# Patient Record
Sex: Male | Born: 1965 | Race: Black or African American | Hispanic: No | Marital: Married | Smoking: Current every day smoker
Health system: Southern US, Community
[De-identification: ages and names within clinical notes are randomized; demographics above are authoritative.]

## PROBLEM LIST (undated history)

## (undated) DIAGNOSIS — Z789 Other specified health status: Secondary | ICD-10-CM

## (undated) DIAGNOSIS — D649 Anemia, unspecified: Secondary | ICD-10-CM

## (undated) HISTORY — DX: Anemia, unspecified: D64.9

---

## 2000-11-28 ENCOUNTER — Emergency Department (HOSPITAL_COMMUNITY): Admission: EM | Admit: 2000-11-28 | Discharge: 2000-11-28 | Payer: Self-pay | Admitting: Emergency Medicine

## 2000-11-28 ENCOUNTER — Encounter: Payer: Self-pay | Admitting: Emergency Medicine

## 2005-02-09 ENCOUNTER — Emergency Department: Payer: Self-pay | Admitting: Emergency Medicine

## 2007-03-13 ENCOUNTER — Emergency Department: Payer: Self-pay | Admitting: Emergency Medicine

## 2009-01-16 ENCOUNTER — Emergency Department: Payer: Self-pay | Admitting: Unknown Physician Specialty

## 2009-01-27 ENCOUNTER — Emergency Department: Payer: Self-pay | Admitting: Emergency Medicine
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
## 2014-12-04 ENCOUNTER — Emergency Department
Admission: EM | Admit: 2014-12-04 | Discharge: 2014-12-04 | Disposition: A | Discharge status: Home / Self Care | Payer: Self-pay | Attending: Emergency Medicine | Admitting: Emergency Medicine

## 2014-12-04 ENCOUNTER — Encounter: Payer: Self-pay | Admitting: Emergency Medicine

## 2014-12-04 DIAGNOSIS — M25461 Effusion, right knee: Secondary | ICD-10-CM | POA: Insufficient documentation

## 2014-12-04 DIAGNOSIS — Z720 Tobacco use: Secondary | ICD-10-CM | POA: Insufficient documentation

## 2014-12-04 MED ORDER — HYDROCODONE-ACETAMINOPHEN 5-325 MG PO TABS: 1.0000 | ORAL_TABLET | ORAL | Status: DC | PRN

## 2014-12-04 MED ORDER — IBUPROFEN 800 MG PO TABS: 800.0000 mg | ORAL_TABLET | Freq: Three times a day (TID) | ORAL | Status: DC | PRN

## 2014-12-04 MED ORDER — COLCHICINE 0.6 MG PO TABS: 1.2000 mg | ORAL_TABLET | Freq: Once | ORAL | Status: DC

## 2014-12-04 NOTE — Discharge Instructions (Signed)

## 2014-12-04 NOTE — ED Provider Notes (Signed)
[facility] Emergency Department Provider Note  ____________________________________________  Time seen: Approximately 9:27 AM  I have reviewed the triage vital signs and the nursing notes.   HISTORY  Chief Complaint Joint Swelling    HPI Devin Farrierdwin Devin Matthews is a 49 y.o. male is a one-week history of right knee pain progressively getting worse. Complains of increased swelling and tenderness. No warmth. Denies any recent injury or trauma.   History reviewed. No pertinent past medical history.  There are no active problems to display for this patient.   History reviewed. No pertinent past surgical history.  Current Outpatient Rx  Name  Route  Sig  Dispense  Refill  . colchicine 0.6 MG tablet   Oral   Take 2 tablets (1.2 mg total) by mouth once.   3 tablet   0   . HYDROcodone-acetaminophen (NORCO) 5-325 MG per tablet   Oral   Take 1-2 tablets by mouth every 4 (four) hours as needed for moderate pain.   15 tablet   0   . ibuprofen (ADVIL,MOTRIN) 800 MG tablet   Oral   Take 1 tablet (800 mg total) by mouth every 8 (eight) hours as needed.   30 tablet   0     Allergies Review of patient's allergies indicates no known allergies.  No family history on file.  Social History History  Substance Use Topics  . Smoking status: Current Every Day Smoker -- 1.00 packs/day    Types: Cigarettes  . Smokeless tobacco: Not on file  . Alcohol Use: Yes     Comment: beer    Review of Systems Constitutional: No fever/chills Eyes: No visual changes. ENT: No sore throat. Cardiovascular: Denies chest pain. Respiratory: Denies shortness of breath. Gastrointestinal: No abdominal pain.  No nausea, no vomiting.  No diarrhea.  No constipation. Genitourinary: Negative for dysuria. Musculoskeletal: Positive for right knee swelling and pain.. Skin: Negative for rash. Neurological: Negative for headaches, focal weakness or numbness.  10-point ROS otherwise  negative.  ____________________________________________   PHYSICAL EXAM:  VITAL SIGNS: ED Triage Vitals  Enc Vitals Group     BP 12/04/14 0919 115/84 mmHg     Pulse Rate 12/04/14 0919 74     Resp 12/04/14 0919 17     Temp 12/04/14 0919 97.6 F (36.4 C)     Temp Source 12/04/14 0919 Oral     SpO2 12/04/14 0919 99 %     Weight 12/04/14 0916 145 lb (65.772 kg)     Height 12/04/14 0916 5\' 11"  (1.803 m)     Head Cir --      Peak Flow --      Pain Score 12/04/14 0917 5     Pain Loc --      Pain Edu? --      Excl. in GC? --     Constitutional: Alert and oriented. Well appearing and in no acute distress. Eyes: Conjunctivae are normal. PERRL. EOMI. Head: Atraumatic. Nose: No congestion/rhinnorhea. Mouth/Throat: Mucous membranes are moist.  Oropharynx non-erythematous. Neck: No stridor.   Cardiovascular: Normal rate, regular rhythm. Grossly normal heart sounds.  Good peripheral circulation. Respiratory: Normal respiratory effort.  No retractions. Lungs CTAB. Gastrointestinal: Soft and nontender. No distention. No abdominal bruits. No CVA tenderness. Musculoskeletal: Positive for effusion to the right knee with warmth and tenderness. Range of motion no increased pain with anterior drawer/posterior drawer. No laxity noted. Neurologic:  Normal speech and language. No gross focal neurologic deficits are appreciated. Speech is normal.  No gait instability. Skin:  Skin is warm, dry and intact. No rash noted. Psychiatric: Mood and affect are normal. Speech and behavior are normal.  ____________________________________________   LABS (all labs ordered are listed, but only abnormal results are displayed)  Labs Reviewed - No data to display ____________________________________________   RADIOLOGY  Deferred ____________________________________________   PROCEDURES  Procedure(s) performed: None  Critical Care performed:  No  ____________________________________________   INITIAL IMPRESSION / ASSESSMENT AND PLAN / ED COURSE  Pertinent labs & imaging results that were available during my care of the patient were reviewed by me and considered in my medical decision making (see chart for details).  Right knee effusion, consider acute gouty arthritis.. Rx given for treatment 100 mg 3 times a day, and Norco 5/325, colchicine 0.6 mg 2 initially then one 1 hour. Patient follow-up with orthopedics on call.  Patient voices no other emergency medical complaints at this time and will return to the ER with worsening symptomology. ____________________________________________   FINAL CLINICAL IMPRESSION(S) / ED DIAGNOSES  Final diagnoses:  Knee effusion, right      Devin Dakin, PA-C 12/04/14 0940  Minna Antis, MD 12/04/14 1453
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
## 2023-02-01 ENCOUNTER — Encounter (HOSPITAL_COMMUNITY): Payer: Self-pay

## 2023-02-01 ENCOUNTER — Other Ambulatory Visit: Payer: Self-pay

## 2023-02-01 ENCOUNTER — Emergency Department (HOSPITAL_COMMUNITY)
Admission: EM | Admit: 2023-02-01 | Discharge: 2023-02-01 | Discharge status: Against Medical Advice | Payer: 59 | Attending: Emergency Medicine | Admitting: Emergency Medicine

## 2023-02-01 DIAGNOSIS — R222 Localized swelling, mass and lump, trunk: Secondary | ICD-10-CM | POA: Insufficient documentation

## 2023-02-01 DIAGNOSIS — Z5321 Procedure and treatment not carried out due to patient leaving prior to being seen by health care provider: Secondary | ICD-10-CM | POA: Insufficient documentation

## 2023-02-01 HISTORY — DX: Other specified health status: Z78.9

## 2023-02-01 NOTE — ED Triage Notes (Signed)
"  Noticed I have a knot on the left side of my groin yesterday and not sure where it came from" per pt  Denies injury.

## 2024-02-22 ENCOUNTER — Encounter: Payer: Self-pay | Admitting: Emergency Medicine

## 2024-02-22 ENCOUNTER — Emergency Department: Payer: Self-pay

## 2024-02-22 ENCOUNTER — Other Ambulatory Visit: Payer: Self-pay

## 2024-02-22 ENCOUNTER — Observation Stay
Admission: EM | Admit: 2024-02-22 | Discharge: 2024-02-24 | Disposition: A | Discharge status: Home / Self Care | Payer: Self-pay | Attending: Gastroenterology | Admitting: Gastroenterology

## 2024-02-22 DIAGNOSIS — K921 Melena: Secondary | ICD-10-CM | POA: Insufficient documentation

## 2024-02-22 DIAGNOSIS — J449 Chronic obstructive pulmonary disease, unspecified: Secondary | ICD-10-CM | POA: Insufficient documentation

## 2024-02-22 DIAGNOSIS — K640 First degree hemorrhoids: Secondary | ICD-10-CM | POA: Insufficient documentation

## 2024-02-22 DIAGNOSIS — K5730 Diverticulosis of large intestine without perforation or abscess without bleeding: Secondary | ICD-10-CM | POA: Insufficient documentation

## 2024-02-22 DIAGNOSIS — F17200 Nicotine dependence, unspecified, uncomplicated: Secondary | ICD-10-CM | POA: Diagnosis present

## 2024-02-22 DIAGNOSIS — R0789 Other chest pain: Secondary | ICD-10-CM

## 2024-02-22 DIAGNOSIS — K922 Gastrointestinal hemorrhage, unspecified: Principal | ICD-10-CM | POA: Insufficient documentation

## 2024-02-22 DIAGNOSIS — D649 Anemia, unspecified: Secondary | ICD-10-CM

## 2024-02-22 DIAGNOSIS — D62 Acute posthemorrhagic anemia: Secondary | ICD-10-CM | POA: Insufficient documentation

## 2024-02-22 DIAGNOSIS — F1090 Alcohol use, unspecified, uncomplicated: Secondary | ICD-10-CM | POA: Diagnosis present

## 2024-02-22 DIAGNOSIS — R079 Chest pain, unspecified: Secondary | ICD-10-CM | POA: Diagnosis present

## 2024-02-22 DIAGNOSIS — D500 Iron deficiency anemia secondary to blood loss (chronic): Secondary | ICD-10-CM | POA: Insufficient documentation

## 2024-02-22 DIAGNOSIS — F1020 Alcohol dependence, uncomplicated: Secondary | ICD-10-CM | POA: Insufficient documentation

## 2024-02-22 DIAGNOSIS — K274 Chronic or unspecified peptic ulcer, site unspecified, with hemorrhage: Secondary | ICD-10-CM

## 2024-02-22 DIAGNOSIS — F17210 Nicotine dependence, cigarettes, uncomplicated: Secondary | ICD-10-CM | POA: Insufficient documentation

## 2024-02-22 LAB — CBC
HCT: 17 % — ABNORMAL LOW (ref 39.0–52.0)
HCT: 19 % — ABNORMAL LOW (ref 39.0–52.0)
HCT: 23.3 % — ABNORMAL LOW (ref 39.0–52.0)
Hemoglobin: 5.2 g/dL — ABNORMAL LOW (ref 13.0–17.0)
Hemoglobin: 6 g/dL — ABNORMAL LOW (ref 13.0–17.0)
Hemoglobin: 7.6 g/dL — ABNORMAL LOW (ref 13.0–17.0)
MCH: 30.1 pg (ref 26.0–34.0)
MCH: 30.5 pg (ref 26.0–34.0)
MCH: 29.8 pg (ref 26.0–34.0)
MCHC: 30.6 g/dL (ref 30.0–36.0)
MCHC: 31.6 g/dL (ref 30.0–36.0)
MCHC: 32.6 g/dL (ref 30.0–36.0)
MCV: 98.3 fL (ref 80.0–100.0)
MCV: 96.4 fL (ref 80.0–100.0)
MCV: 91.4 fL (ref 80.0–100.0)
Platelets: 384 K/uL (ref 150–400)
Platelets: 359 K/uL (ref 150–400)
Platelets: 348 K/uL (ref 150–400)
RBC: 1.73 MIL/uL — ABNORMAL LOW (ref 4.22–5.81)
RBC: 1.97 MIL/uL — ABNORMAL LOW (ref 4.22–5.81)
RBC: 2.55 MIL/uL — ABNORMAL LOW (ref 4.22–5.81)
RDW: 16.5 % — ABNORMAL HIGH (ref 11.5–15.5)
RDW: 16.6 % — ABNORMAL HIGH (ref 11.5–15.5)
RDW: 17.5 % — ABNORMAL HIGH (ref 11.5–15.5)
WBC: 4.1 K/uL (ref 4.0–10.5)
WBC: 3.1 K/uL — ABNORMAL LOW (ref 4.0–10.5)
WBC: 4.4 K/uL (ref 4.0–10.5)
nRBC: 0 % (ref 0.0–0.2)
nRBC: 0 % (ref 0.0–0.2)
nRBC: 0 % (ref 0.0–0.2)

## 2024-02-22 LAB — PROTIME-INR
INR: 1 (ref 0.8–1.2)
Prothrombin Time: 14.1 s (ref 11.4–15.2)

## 2024-02-22 LAB — BASIC METABOLIC PANEL WITH GFR
Anion gap: 11 (ref 5–15)
BUN: 10 mg/dL (ref 6–20)
CO2: 23 mmol/L (ref 22–32)
Calcium: 7.9 mg/dL — ABNORMAL LOW (ref 8.9–10.3)
Chloride: 101 mmol/L (ref 98–111)
Creatinine, Ser: 0.79 mg/dL (ref 0.61–1.24)
GFR, Estimated: >60 mL/min (ref >60)
Glucose, Bld: 105 mg/dL — ABNORMAL HIGH (ref 70–99)
Potassium: 3.6 mmol/L (ref 3.5–5.1)
Sodium: 135 mmol/L (ref 135–145)

## 2024-02-22 LAB — APTT: aPTT: 28 s (ref 24–36)

## 2024-02-22 LAB — LIPASE, BLOOD: Lipase: 29 U/L (ref 11–51)

## 2024-02-22 LAB — ABO/RH: ABO/RH(D): O POS

## 2024-02-22 LAB — PREPARE RBC (CROSSMATCH)

## 2024-02-22 LAB — HIV ANTIBODY (ROUTINE TESTING W REFLEX): HIV Screen 4th Generation wRfx: NONREACTIVE

## 2024-02-22 LAB — TROPONIN I (HIGH SENSITIVITY)
Troponin I (High Sensitivity): 12 ng/L (ref <18)
Troponin I (High Sensitivity): 13 ng/L (ref <18)

## 2024-02-22 MED ORDER — PANTOPRAZOLE SODIUM 40 MG IV SOLR
40.0000 mg | Freq: Two times a day (BID) | INTRAVENOUS | Status: DC
  Administered 2024-02-22 – 2024-02-23 (×3): 40 mg via INTRAVENOUS
  Filled 2024-02-22 (×3): qty 10

## 2024-02-22 MED ORDER — SODIUM CHLORIDE 0.9 % IV BOLUS
1000.0000 mL | Freq: Once | INTRAVENOUS | Status: AC
  Administered 2024-02-22: 1000 mL via INTRAVENOUS

## 2024-02-22 MED ORDER — ONDANSETRON HCL 4 MG/2ML IJ SOLN: 4.0000 mg | Freq: Four times a day (QID) | INTRAMUSCULAR | Status: DC | PRN

## 2024-02-22 MED ORDER — PANTOPRAZOLE SODIUM 40 MG IV SOLR: 40.0000 mg | Freq: Two times a day (BID) | INTRAVENOUS | Status: DC

## 2024-02-22 MED ORDER — SODIUM CHLORIDE 0.9 % IV SOLN: INTRAVENOUS | Status: AC

## 2024-02-22 MED ORDER — PANTOPRAZOLE SODIUM 40 MG IV SOLR
40.0000 mg | INTRAVENOUS | Status: AC
  Administered 2024-02-22 (×2): 40 mg via INTRAVENOUS
  Filled 2024-02-22 (×2): qty 10

## 2024-02-22 MED ORDER — ACETAMINOPHEN 325 MG PO TABS: 650.0000 mg | ORAL_TABLET | ORAL | Status: DC | PRN

## 2024-02-22 MED ORDER — SODIUM CHLORIDE 0.9 % IV SOLN: 10.0000 mL/h | Freq: Once | INTRAVENOUS | Status: DC

## 2024-02-22 NOTE — ED Notes (Signed)
 Advised nurse that patient has ready bed

## 2024-02-22 NOTE — ED Provider Notes (Signed)
 [facility] Provider Note    Event Date/Time   First MD Initiated Contact with Patient 02/22/24 [phone]     (approximate)   History   Chest Pain   HPI  Devin Matthews is a 58 y.o. male who presents to the ED for evaluation of Chest Pain   Patient presents to the ED alongside his wife for evaluation of exertional chest discomfort, dyspnea on exertion, presyncope and melena over the past few days.  Reports drinking a sixpack of beer every 1-2 days.  Infrequent but occasional ibuprofen  and BC powder use.  Has never had an endoscopy, no known medical problems or prescribed medications.   Physical Exam   Triage Vital Signs: ED Triage Vitals  Encounter Vitals Group     BP 02/22/24 0807 119/72     Girls Systolic BP Percentile --      Girls Diastolic BP Percentile --      Boys Systolic BP Percentile --      Boys Diastolic BP Percentile --      Pulse Rate 02/22/24 0807 86     Resp 02/22/24 0807 17     Temp 02/22/24 0807 98.3 F (36.8 C)     Temp Source 02/22/24 0807 Oral     SpO2 02/22/24 0807 97 %     Weight 02/22/24 0806 145 lb (65.8 kg)     Height 02/22/24 0806 5' 9 (1.753 m)     Head Circumference --      Peak Flow --      Pain Score 02/22/24 0806 6     Pain Loc --      Pain Education --      Exclude from Growth Chart --     Most recent vital signs: Vitals:   02/22/24 0807  BP: 119/72  Pulse: 86  Resp: 17  Temp: 98.3 F (36.8 C)  SpO2: 97%    General: Awake, no distress.  CV:  Good peripheral perfusion.  Resp:  Normal effort.  Abd:  No distention.  MSK:  No deformity noted.  Neuro:  No focal deficits appreciated. Other:     ED Results / Procedures / Treatments   Labs (all labs ordered are listed, but only abnormal results are displayed) Labs Reviewed  BASIC METABOLIC PANEL WITH GFR - Abnormal; Notable for the following components:      Result Value   Glucose, Bld 105 (*)    Calcium 7.9 (*)    All other components within  normal limits  CBC - Abnormal; Notable for the following components:   RBC 1.73 (*)    Hemoglobin 5.2 (*)    HCT 17.0 (*)    RDW 16.5 (*)    All other components within normal limits  APTT  PROTIME-INR  LIPASE, BLOOD  TYPE AND SCREEN  ABO/RH  PREPARE RBC (CROSSMATCH)  TROPONIN I (HIGH SENSITIVITY)  TROPONIN I (HIGH SENSITIVITY)    EKG Sinus rhythm with a rate of 80 bpm.  Normal axis and intervals.  No clear signs of acute ischemia.  Biphasic T waves to V2.  No comparison EKG.  RADIOLOGY CXR interpreted by me without evidence of acute cardiopulmonary pathology.  Official radiology report(s): DG Chest 2 View Result Date: 02/22/2024 EXAM: 2 VIEW(S) XRAY OF THE CHEST 02/22/2024 08:25:22 AM COMPARISON: None available. CLINICAL HISTORY: cp. Chest pain that radiates to left arm x 3 days that is worse when walking around. Current smoker. FINDINGS: LUNGS AND PLEURA: No focal pulmonary opacity.  No pulmonary edema. No pleural effusion. No pneumothorax. HEART AND MEDIASTINUM: No acute abnormality of the cardiac and mediastinal silhouettes. BONES AND SOFT TISSUES: Multilevel degenerative changes of thoracic spine. IMPRESSION: 1. No acute cardiopulmonary process. Electronically signed by: Waddell Calk MD 02/22/2024 08:46 AM EDT RP Workstation: [id number]    PROCEDURES and INTERVENTIONS:  .Critical Care  Performed by: Claudene Rover, MD Authorized by: Claudene Rover, MD   Critical care provider statement:    Critical care time (minutes):  30   Critical care time was exclusive of:  Separately billable procedures and treating other patients   Critical care was necessary to treat or prevent imminent or life-threatening deterioration of the following conditions:  Circulatory failure   Critical care was time spent personally by me on the following activities:  Development of treatment plan with patient or surrogate, discussions with consultants, evaluation of patient's response to treatment,  examination of patient, ordering and review of laboratory studies, ordering and review of radiographic studies, ordering and performing treatments and interventions, pulse oximetry, re-evaluation of patient's condition and review of old charts .1-3 Lead EKG Interpretation  Performed by: Claudene Rover, MD Authorized by: Claudene Rover, MD     Interpretation: normal     ECG rate:  80   ECG rate assessment: normal     Rhythm: sinus rhythm     Ectopy: none     Conduction: normal     Medications  pantoprazole (PROTONIX) injection 40 mg (has no administration in time range)    Followed by  pantoprazole (PROTONIX) injection 40 mg (has no administration in time range)  0.9 %  sodium chloride infusion (has no administration in time range)  sodium chloride 0.9 % bolus 1,000 mL (has no administration in time range)     IMPRESSION / MDM / ASSESSMENT AND PLAN / ED COURSE  I reviewed the triage vital signs and the nursing notes.  Differential diagnosis includes, but is not limited to, ACS, PTX, PNA, muscle strain/spasm, PE, dissection, anxiety, pleural effusion  {Patient presents with symptoms of an acute illness or injury that is potentially life-threatening.  Patient presents to the ED with exertional chest pain, dyspnea with melanotic stools likely symptomatic anemia in the setting of a an upper GI bleed requiring transfusions and medical admission.  Normal vital signs on room air.  Hemoglobin of 5.2 without comparison.  Normal metabolic panel.  Negative troponin, clear CXR nonischemic EKG, doubt ACS or acute cardiopulmonary pathology, more likely symptomatic anemia.  High likelihood of upper GI bleeding, provide Protonix, initiate transfusion of 2 units of blood, consult medicine for admission.      FINAL CLINICAL IMPRESSION(S) / ED DIAGNOSES   Final diagnoses:  Upper GI bleed  Symptomatic anemia  Other chest pain     Rx / DC Orders   ED Discharge Orders     None        Note:   This document was prepared using Dragon voice recognition software and may include unintentional dictation errors.   Claudene Rover, MD 02/22/24 [phone]

## 2024-02-22 NOTE — Consult Note (Signed)
 [facility] GI Inpatient Consult Note   Ladell Francis Boss, M.D.  Reason for Consult: GI bleeding, melena, symptomatic blood loss anemia   Attending Requesting Consult: Crist Rebel, M.D.   History of Present Illness: Devin Matthews is a 58 y.o. male with a history of alcohol abuse and active tobacco use who presents with melena for 2 to 3 days with symptoms of fatigue and shortness of breath.  The patient works as a Administrator, and he noticed decreased exercise tolerance with dyspnea on exertion while working last week.  This was followed by 2-3 episodes of melena per day a large amount which he did not pay attention to until it recurred this morning.  The patient was admitted to the hospital due to a hemoglobin of 5.2.  Patient denies any abdominal pain, nausea, vomiting, hematemesis.  He had some episodes of presyncope at home but rested and avoided passing out.  He admits to drinking 6 beers or more per day as well as an occasional shot of gin on the weekends.  He denies illicit drug use. The patient notably came to the emergency room as well for complaints of chest pain with exertion but has ruled out for myocardial infarction in the emergency room.  There is a tentative plan to perform outpatient stress testing after the etiology of gastrointestinal bleeding is determined and treated. Patient denies frequent NSAID use or previous history of gastrointestinal bleeding.  Past Medical History:  Past Medical History:  Diagnosis Date   Known health problems: none     Problem List: Patient Active Problem List   Diagnosis Date Noted   GI bleeding 02/22/2024   Chest pain 02/22/2024   Alcohol use disorder 02/22/2024   Tobacco dependence 02/22/2024    Past Surgical History: History reviewed. No pertinent surgical history.  Allergies: No Known Allergies  Home Medications: (Not in a hospital admission)  Home medication reconciliation was completed with the patient.    Scheduled Inpatient Medications:    pantoprazole (PROTONIX) IV  40 mg Intravenous Q12H    Continuous Inpatient Infusions:    sodium chloride 100 mL/hr at 02/22/24 1334    PRN Inpatient Medications:  acetaminophen , ondansetron (ZOFRAN) IV  Family History: family history is not on file.   GI Family History: Negative  Social History:   reports that he has been smoking cigarettes. He does not have any smokeless tobacco history on file. He reports current alcohol use. The patient denies ETOH, tobacco, or drug use.    Review of Systems: Review of Systems - History obtained from the patient General ROS: positive for  - fatigue negative for - chills, fever, or weight loss Psychological ROS: negative Ophthalmic ROS: negative ENT ROS: negative Allergy and Immunology ROS: negative Hematological and Lymphatic ROS: negative for - blood clots or blood transfusions Endocrine ROS: negative Respiratory ROS: positive for - shortness of breath negative for - cough, hemoptysis, or sputum changes Cardiovascular ROS: positive for - chest pain, dyspnea on exertion, and shortness of breath negative for - edema, irregular heartbeat, loss of consciousness, orthopnea, or palpitations Genito-Urinary ROS: no dysuria, trouble voiding, or hematuria Musculoskeletal ROS: negative Neurological ROS: no TIA or stroke symptoms Dermatological ROS: negative  Physical Examination: BP 119/77   Pulse 64   Temp 98 F (36.7 C) (Oral)   Resp 15   Ht 5' 9 (1.753 m)   Wt 65.8 kg   SpO2 100%   BMI 21.41 kg/m  Physical Exam Constitutional:  General: He is not in acute distress.    Appearance: He is well-developed and normal weight. He is not ill-appearing, toxic-appearing or diaphoretic.  HENT:     Head: Normocephalic and atraumatic.  Eyes:     General: No scleral icterus.       Right eye: No foreign body or discharge.        Left eye: No discharge.     Extraocular Movements: Extraocular  movements intact.     Right eye: No nystagmus.     Left eye: No nystagmus.     Conjunctiva/sclera:     Left eye: Left conjunctiva is not injected. No chemosis.    Pupils: Pupils are equal, round, and reactive to light.  Cardiovascular:     Rate and Rhythm: Normal rate and regular rhythm.     Pulses: Normal pulses.     Heart sounds: Normal heart sounds. No murmur heard.    No gallop.  Pulmonary:     Effort: No respiratory distress.     Breath sounds: Normal breath sounds. No stridor. No wheezing or rhonchi.  Abdominal:     General: There is no distension.     Palpations: Abdomen is soft. There is no mass.     Tenderness: There is no abdominal tenderness. There is no guarding.     Hernia: No hernia is present.  Musculoskeletal:        General: Normal range of motion.     Cervical back: Normal range of motion.  Skin:    General: Skin is warm and dry.     Capillary Refill: Capillary refill takes less than 2 seconds.     Coloration: Skin is not jaundiced or pale.     Findings: No erythema.  Neurological:     General: No focal deficit present.     Mental Status: He is alert and oriented to person, place, and time. Mental status is at baseline.  Psychiatric:        Mood and Affect: Mood normal.        Behavior: Behavior normal.        Thought Content: Thought content normal.        Judgment: Judgment normal.     Data: Lab Results  Component Value Date   WBC 3.1 (L) 02/22/2024   HGB 6.0 (L) 02/22/2024   HCT 19.0 (L) 02/22/2024   MCV 96.4 02/22/2024   PLT 359 02/22/2024   Recent Labs  Lab 02/22/24 0811 02/22/24 1326  HGB 5.2* 6.0*   Lab Results  Component Value Date   NA 135 02/22/2024   K 3.6 02/22/2024   CL 101 02/22/2024   CO2 23 02/22/2024   BUN 10 02/22/2024   CREATININE 0.79 02/22/2024   No results found for: ALT, AST, GGT, ALKPHOS, BILITOT Recent Labs  Lab 02/22/24 0853  APTT 28  INR 1.0      Latest Ref Rng & Units 02/22/2024    1:26 PM  02/22/2024    8:11 AM  CBC  WBC 4.0 - 10.5 K/uL 3.1  4.1   Hemoglobin 13.0 - 17.0 g/dL 6.0  5.2   Hematocrit 60.9 - 52.0 % 19.0  17.0   Platelets 150 - 400 K/uL 359  384     STUDIES: DG Chest 2 View Result Date: 02/22/2024 EXAM: 2 VIEW(S) XRAY OF THE CHEST 02/22/2024 08:25:22 AM COMPARISON: None available. CLINICAL HISTORY: cp. Chest pain that radiates to left arm x 3 days that is worse when walking around. Current  smoker. FINDINGS: LUNGS AND PLEURA: No focal pulmonary opacity. No pulmonary edema. No pleural effusion. No pneumothorax. HEART AND MEDIASTINUM: No acute abnormality of the cardiac and mediastinal silhouettes. BONES AND SOFT TISSUES: Multilevel degenerative changes of thoracic spine. IMPRESSION: 1. No acute cardiopulmonary process. Electronically signed by: Waddell Calk MD 02/22/2024 08:46 AM EDT RP Workstation: [id number]   @IMAGES @  Assessment: Principal Problem:   GI bleeding Active Problems:   Chest pain   Alcohol use disorder   Tobacco dependence  58 year old male with a history of alcohol abuse and tobacco abuse presents for 3 days of melena with progressive symptoms of fatigue, presyncope and dyspnea on exertion.  Hemoglobin found to be 5.2, most likely secondary to gastrointestinal blood loss.  Differential diagnosis includes peptic ulcer disease, esophageal varices, gastric malignancy, rarely vascular malformation.  Recommendations: 1.  Patient currently hemodynamically stable.  Will begin clear liquid diet. 2.  N.p.o. after midnight. 3.  Continue blood transfusions to hematocrit of at least 7. 4.  IV acid suppression therapy. 5.  Plan EGD for tomorrow.The patient understands the nature of the planned procedure, indications, risks, alternatives and potential complications including but not limited to bleeding, infection, perforation, damage to internal organs and possible oversedation/side effects from anesthesia. The patient agrees and gives consent to proceed.   Please refer to procedure notes for findings, recommendations and patient disposition/instructions. 6. Dr. Jinny will be the performing physician tomorrow.   Thank you for the consult. Please call with questions or concerns.  Aundria Ladell Eck MD [facility] [address] [place], [place] [address] [phone]  02/22/2024 2:02 PM

## 2024-02-22 NOTE — Plan of Care (Signed)
  Problem: Bowel/Gastric: Goal: Will show no signs and symptoms of gastrointestinal bleeding 02/22/2024 1905 by Marnell Elspeth HERO, RN Outcome: Progressing 02/22/2024 1904 by Marnell Elspeth HERO, RN Outcome: Progressing   Problem: Education: Goal: Knowledge of condition and prescribed therapy will improve 02/22/2024 1905 by Marnell Elspeth HERO, RN Outcome: Progressing 02/22/2024 1904 by Marnell Elspeth HERO, RN Outcome: Progressing

## 2024-02-22 NOTE — ED Triage Notes (Signed)
 Patient to ED via centralized CP that radiates to left arm. States ongoing for a few days. Denies cardiac hx. States pain worse when walking.

## 2024-02-22 NOTE — ED Notes (Signed)
 Per MD recheck H&H and let MD know if HGB is <=7.

## 2024-02-22 NOTE — H&P (Signed)
 History and Physical    Devin Matthews:[id number] DOB: 1965-12-15 DOA: 02/22/2024  DOS: the patient was seen and examined on 02/22/2024  PCP: Patient, [name] Per   Patient coming from: Home  I have personally briefly reviewed patient's old medical records in [facility]  Chief Complaint: Chest pain  HPI: Devin Matthews is a pleasant 58 y.o. male with medical history significant for alcohol use disorder, chronic smoking who presented to ED for chest pain.  Patient complains that while he was walking he developed chest pain this morning that made him stop 15 minutes and wait to get better and started walking.  He stated that his pain was in the retrosternal area, 5/10 in intensity, sharp, acid with some shortness of breath no nausea no vomiting no diarrhea no palpitations.  He also had similar symptoms yesterday and he took 1 to 2 pills of Motrin .  He also stated that he had been having black stools for the last couple of days.  This morning he had a bowel movement which was completely black.  Patient endorsed that he drinks alcohol and last drink was yesterday, 2 beers. Patient denied any similar symptoms in the past.  He denies any fever or chills.  He denies any hematemesis or hemoptysis but endorsed that he had melena.  ED Course: Upon arrival to the ED, patient is found to be anemic at 5.2.  Troponins were negative, EKG was normal.  Hospitalist service was consulted for evaluation for admission for acute GI bleeding.  Review of Systems:  ROS  All other systems negative except as noted in the HPI.  Past Medical History:  Diagnosis Date   Known health problems: none     History reviewed. No pertinent surgical history.   reports that he has been smoking cigarettes. He does not have any smokeless tobacco history on file. He reports current alcohol use. No history on file for drug use.  No Known Allergies  History reviewed. No pertinent family history.  Prior to Admission  medications   Medication Sig Start Date End Date Taking? Authorizing Provider  colchicine  0.6 MG tablet Take 2 tablets (1.2 mg total) by mouth once. 12/04/14   Beers, Carlin HERO, PA-C  HYDROcodone -acetaminophen  (NORCO) 5-325 MG per tablet Take 1-2 tablets by mouth every 4 (four) hours as needed for moderate pain. 12/04/14   Beers, Carlin HERO, PA-C  ibuprofen  (ADVIL ,MOTRIN ) 800 MG tablet Take 1 tablet (800 mg total) by mouth every 8 (eight) hours as needed. 12/04/14   Shelda Carlin HERO, PA-C    Physical Exam: Vitals:   02/22/24 0806 02/22/24 0807 02/22/24 1032 02/22/24 1059  BP:  119/72 107/72 118/75  Pulse:  86 66 66  Resp:  17 16 16   Temp:  98.3 F (36.8 C) 98.4 F (36.9 C) 98 F (36.7 C)  TempSrc:  Oral Oral Oral  SpO2:  97% 100% 100%  Weight: 65.8 kg     Height: 5' 9 (1.753 m)       Physical Exam   Constitutional: Alert, awake, calm, comfortable HEENT: Neck supple Respiratory: Clear to auscultation B/L, no wheezing, no rales.  Cardiovascular: Regular rate and rhythm, no murmurs / rubs / gallops. No extremity edema. 2+ pedal pulses. No carotid bruits.  Abdomen: Soft, no tenderness, Bowel sounds positive.  Musculoskeletal: no clubbing / cyanosis. Good ROM, no contractures. Normal muscle tone.  Skin: no rashes, lesions, ulcers. Neurologic: CN 2-12 grossly intact. Sensation intact, No focal deficit identified Psychiatric: Alert  and oriented x 3. Normal mood.    Labs on Admission: I have personally reviewed following labs and imaging studies  CBC: Recent Labs  Lab 02/22/24 0811  WBC 4.1  HGB 5.2*  HCT 17.0*  MCV 98.3  PLT 384   Basic Metabolic Panel: Recent Labs  Lab 02/22/24 0811  NA 135  K 3.6  CL 101  CO2 23  GLUCOSE 105*  BUN 10  CREATININE 0.79  CALCIUM 7.9*   GFR: Estimated Creatinine Clearance: 94.8 mL/min (by C-G formula based on SCr of 0.79 mg/dL). Liver Function Tests: No results for input(s): AST, ALT, ALKPHOS, BILITOT, PROT, ALBUMIN in  the last 168 hours. No results for input(s): LIPASE, AMYLASE in the last 168 hours. No results for input(s): AMMONIA in the last 168 hours. Coagulation Profile: Recent Labs  Lab 02/22/24 0853  INR 1.0   Cardiac Enzymes: Recent Labs  Lab 02/22/24 0811 02/22/24 1017  TROPONINIHS 12 13   BNP (last 3 results) No results for input(s): BNP in the last 8760 hours. HbA1C: No results for input(s): HGBA1C in the last 72 hours. CBG: No results for input(s): GLUCAP in the last 168 hours. Lipid Profile: No results for input(s): CHOL, HDL, LDLCALC, TRIG, CHOLHDL, LDLDIRECT in the last 72 hours. Thyroid Function Tests: No results for input(s): TSH, T4TOTAL, FREET4, T3FREE, THYROIDAB in the last 72 hours. Anemia Panel: No results for input(s): VITAMINB12, FOLATE, FERRITIN, TIBC, IRON, RETICCTPCT in the last 72 hours. Urine analysis: No results found for: COLORURINE, APPEARANCEUR, LABSPEC, PHURINE, GLUCOSEU, HGBUR, BILIRUBINUR, KETONESUR, PROTEINUR, UROBILINOGEN, NITRITE, LEUKOCYTESUR  Radiological Exams on Admission: I have personally reviewed images DG Chest 2 View Result Date: 02/22/2024 EXAM: 2 VIEW(S) XRAY OF THE CHEST 02/22/2024 08:25:22 AM COMPARISON: None available. CLINICAL HISTORY: cp. Chest pain that radiates to left arm x 3 days that is worse when walking around. Current smoker. FINDINGS: LUNGS AND PLEURA: No focal pulmonary opacity. No pulmonary edema. No pleural effusion. No pneumothorax. HEART AND MEDIASTINUM: No acute abnormality of the cardiac and mediastinal silhouettes. BONES AND SOFT TISSUES: Multilevel degenerative changes of thoracic spine. IMPRESSION: 1. No acute cardiopulmonary process. Electronically signed by: Waddell Calk MD 02/22/2024 08:46 AM EDT RP Workstation: [id number]    EKG: My personal interpretation of EKG shows: Sinus rhythm, no ST elevation    Assessment/Plan Principal Problem:   GI  bleeding Active Problems:   Chest pain   Alcohol use disorder   Tobacco dependence    Assessment and Plan: 58 year old male with history of chronic smoking, alcohol use who came into the hospital complaining of chest pain now found to have severe anemia due to acute GI bleed.  1.  Acute blood loss anemia due to upper GI bleeding - He will be placed in observation - Type and cross - 2 units PRBC transfusion - Monitor in telemetry - Protonix IV twice daily, n.p.o., IV fluid, - Monitor CBC - GI evaluation  2.  Chest pain - Likely due to severe anemia - Troponins and EKG ordered unremarkable - Will continue to monitor if he does not have further chest pain then he will need to follow-up as outpatient  3.  Tobacco dependence, alcohol use, marijuana use - Extensive counseling was done - No signs of alcohol withdrawal - Never been in the hospital for alcohol-related problems     DVT prophylaxis: SCDs Code Status: Full Code Family Communication: None Disposition Plan: Home Consults called: GI Admission status: Observation, Med-Surg   Nena Rebel, MD [facility] 02/22/2024, 11:03  AM

## 2024-02-23 ENCOUNTER — Observation Stay: Payer: Self-pay | Admitting: Certified Registered Nurse Anesthetist

## 2024-02-23 ENCOUNTER — Encounter: Payer: Self-pay | Admitting: Gastroenterology

## 2024-02-23 ENCOUNTER — Encounter
Admission: EM | Disposition: A | Discharge status: Home / Self Care | Payer: Self-pay | Source: Home / Self Care | Attending: Emergency Medicine

## 2024-02-23 DIAGNOSIS — K921 Melena: Secondary | ICD-10-CM

## 2024-02-23 DIAGNOSIS — K922 Gastrointestinal hemorrhage, unspecified: Secondary | ICD-10-CM

## 2024-02-23 DIAGNOSIS — D649 Anemia, unspecified: Secondary | ICD-10-CM

## 2024-02-23 DIAGNOSIS — K2980 Duodenitis without bleeding: Secondary | ICD-10-CM

## 2024-02-23 DIAGNOSIS — D509 Iron deficiency anemia, unspecified: Secondary | ICD-10-CM

## 2024-02-23 DIAGNOSIS — D500 Iron deficiency anemia secondary to blood loss (chronic): Secondary | ICD-10-CM | POA: Insufficient documentation

## 2024-02-23 HISTORY — PX: ESOPHAGOGASTRODUODENOSCOPY: SHX5428

## 2024-02-23 LAB — HEPATIC FUNCTION PANEL
ALT: 14 U/L (ref 0–44)
AST: 25 U/L (ref 15–41)
Albumin: 3.1 g/dL — ABNORMAL LOW (ref 3.5–5.0)
Alkaline Phosphatase: 46 U/L (ref 38–126)
Bilirubin, Direct: 0.1 mg/dL (ref 0.0–0.2)
Indirect Bilirubin: 0.5 mg/dL (ref 0.3–0.9)
Total Bilirubin: 0.6 mg/dL (ref 0.0–1.2)
Total Protein: 6 g/dL — ABNORMAL LOW (ref 6.5–8.1)

## 2024-02-23 LAB — HEMOGLOBIN: Hemoglobin: 8.6 g/dL — ABNORMAL LOW (ref 13.0–17.0)

## 2024-02-23 LAB — CBC
HCT: 20 % — ABNORMAL LOW (ref 39.0–52.0)
Hemoglobin: 6.7 g/dL — ABNORMAL LOW (ref 13.0–17.0)
MCH: 30.3 pg (ref 26.0–34.0)
MCHC: 33.5 g/dL (ref 30.0–36.0)
MCV: 90.5 fL (ref 80.0–100.0)
Platelets: 316 K/uL (ref 150–400)
RBC: 2.21 MIL/uL — ABNORMAL LOW (ref 4.22–5.81)
RDW: 17.7 % — ABNORMAL HIGH (ref 11.5–15.5)
WBC: 5.6 K/uL (ref 4.0–10.5)
nRBC: 0 % (ref 0.0–0.2)

## 2024-02-23 LAB — FERRITIN: Ferritin: 7 ng/mL — ABNORMAL LOW (ref 24–336)

## 2024-02-23 LAB — PREPARE RBC (CROSSMATCH)

## 2024-02-23 LAB — BRAIN NATRIURETIC PEPTIDE: B Natriuretic Peptide: 192.4 pg/mL — ABNORMAL HIGH (ref 0.0–100.0)

## 2024-02-23 LAB — HEMOGLOBIN AND HEMATOCRIT, BLOOD
HCT: 24.5 % — ABNORMAL LOW (ref 39.0–52.0)
Hemoglobin: 8.1 g/dL — ABNORMAL LOW (ref 13.0–17.0)

## 2024-02-23 SURGERY — EGD (ESOPHAGOGASTRODUODENOSCOPY)
Anesthesia: General

## 2024-02-23 MED ORDER — PROPOFOL 500 MG/50ML IV EMUL
INTRAVENOUS | Status: DC | PRN
  Administered 2024-02-23: 160 ug/kg/min via INTRAVENOUS

## 2024-02-23 MED ORDER — LIDOCAINE HCL (CARDIAC) PF 100 MG/5ML IV SOSY
PREFILLED_SYRINGE | INTRAVENOUS | Status: DC | PRN
  Administered 2024-02-23: 8 mg via INTRAVENOUS

## 2024-02-23 MED ORDER — SODIUM CHLORIDE 0.9% IV SOLUTION: Freq: Once | INTRAVENOUS | Status: AC

## 2024-02-23 MED ORDER — SODIUM CHLORIDE 0.9 % IV SOLN
INTRAVENOUS | Status: AC
Start: 2024-02-23 — End: 2024-02-24

## 2024-02-23 MED ORDER — PROPOFOL 10 MG/ML IV BOLUS
INTRAVENOUS | Status: DC | PRN
  Administered 2024-02-23: 20 mg via INTRAVENOUS
  Administered 2024-02-23: 70 mg via INTRAVENOUS

## 2024-02-23 MED ORDER — ADULT MULTIVITAMIN W/MINERALS CH: 1.0000 | ORAL_TABLET | Freq: Every day | ORAL | Status: DC

## 2024-02-23 MED ORDER — SODIUM CHLORIDE 0.9 % IV SOLN: INTRAVENOUS | Status: DC

## 2024-02-23 MED ORDER — PEG 3350-KCL-NA BICARB-NACL 420 G PO SOLR
4000.0000 mL | Freq: Once | ORAL | Status: AC
  Administered 2024-02-23: 4000 mL via ORAL
  Filled 2024-02-23: qty 4000

## 2024-02-23 NOTE — Anesthesia Preprocedure Evaluation (Addendum)
 Anesthesia Evaluation  Patient identified by MRN, date of birth, ID band Patient awake    Reviewed: Allergy & Precautions, NPO status , Patient's Chart, lab work & pertinent test results  Airway Mallampati: III  TM Distance: >3 FB Neck ROM: full    Dental  (+) Missing, Poor Dentition, Chipped, Dental Advisory Given   Pulmonary neg pulmonary ROS, COPD,  COPD inhaler, Current Smoker and Patient abstained from smoking.   Pulmonary exam normal  + decreased breath sounds      Cardiovascular Exercise Tolerance: Good negative cardio ROS Normal cardiovascular exam Rhythm:Regular Rate:Normal     Neuro/Psych negative neurological ROS  negative psych ROS   GI/Hepatic negative GI ROS, Neg liver ROS,,,  Endo/Other  negative endocrine ROS    Renal/GU negative Renal ROS  negative genitourinary   Musculoskeletal   Abdominal   Peds negative pediatric ROS (+)  Hematology negative hematology ROS (+)   Anesthesia Other Findings Past Medical History: No date: Known health problems: none  History reviewed. No pertinent surgical history.  BMI    Body Mass Index: 21.41 kg/m      Reproductive/Obstetrics negative OB ROS                              Anesthesia Physical Anesthesia Plan  ASA: 3  Anesthesia Plan: General   Post-op Pain Management:    Induction: Intravenous  PONV Risk Score and Plan: Propofol infusion and TIVA  Airway Management Planned: Natural Airway and Nasal Cannula  Additional Equipment:   Intra-op Plan:   Post-operative Plan:   Informed Consent: I have reviewed the patients History and Physical, chart, labs and discussed the procedure including the risks, benefits and alternatives for the proposed anesthesia with the patient or authorized representative who has indicated his/her understanding and acceptance.     Dental Advisory Given  Plan Discussed with:  CRNA  Anesthesia Plan Comments:         Anesthesia Quick Evaluation

## 2024-02-23 NOTE — Progress Notes (Signed)
 PROGRESS NOTE    Devin Matthews  FMW:[id number] DOB: 01-Sep-1965 DOA: 02/22/2024 PCP: Patient, [name] Per  Outpatient Specialists: none    Brief Narrative:   From admission h and p  Devin Matthews is a pleasant 58 y.o. male with medical history significant for alcohol use disorder, chronic smoking who presented to ED for chest pain.  Patient complains that while he was walking he developed chest pain this morning that made him stop 15 minutes and wait to get better and started walking.  He stated that his pain was in the retrosternal area, 5/10 in intensity, sharp, acid with some shortness of breath no nausea no vomiting no diarrhea no palpitations.  He also had similar symptoms yesterday and he took 1 to 2 pills of Motrin .  He also stated that he had been having black stools for the last couple of days.  This morning he had a bowel movement which was completely black.  Patient endorsed that he drinks alcohol and last drink was yesterday, 2 beers. Patient denied any similar symptoms in the past.  He denies any fever or chills.  He denies any hematemesis or hemoptysis but endorsed that he had melena.   Assessment & Plan:   Principal Problem:   GI bleeding Active Problems:   Chest pain   Alcohol use disorder   Tobacco dependence  # Melena Reports one week of this, with normocytic anemia. No regular nsaid use but does drink heavily - for EGD today  # normocytic anemia Likely 2/2 recent blood loss with one week of melena. Hgb 5.2 on arrival, transfused a total of 3 units, hgb with appropriate rise to 8.1 this morning - continue to trend - continue IV PPI  # Chest pain # Exertional dyspnea Likely 2/2 anemia. Serial troponins negative. Symptoms improved with blood transfusion. CXR clear - monitor  # Alcohol abuse Reports drinking a 6-pack of beer daily. Denies history of withdrawal, no symptoms of such today - monitor - start multivitamin - f/u LFTs  # HCM HIV neg - f/u  hcv   DVT prophylaxis: SCDs Code Status: full Family Communication: none at bedside  Level of care: Telemetry Medical Status is: Observation    Consultants:  GI  Procedures: EGD pending  Antimicrobials:  none    Subjective: Reports significant improvement in exertional dyspnea. No chest pain or cough  Objective: Vitals:   02/23/24 0251 02/23/24 0438 02/23/24 0508 02/23/24 0725  BP: 107/68 113/68 109/72 119/77  Pulse: 73 71 69 69  Resp: 17 18 19 16   Temp: 98.3 F (36.8 C) 98.3 F (36.8 C) 98.8 F (37.1 C) 98.6 F (37 C)  TempSrc: Oral  Oral Oral  SpO2: 99% 100% 98% 99%  Weight:      Height:        Intake/Output Summary (Last 24 hours) at 02/23/2024 0907 Last data filed at 02/23/2024 0515 Gross per 24 hour  Intake 1969.54 ml  Output 500 ml  Net 1469.54 ml   Filed Weights   02/22/24 0806  Weight: 65.8 kg    Examination:  General exam: Appears calm and comfortable  Respiratory system: Clear to auscultation. Respiratory effort normal. Cardiovascular system: S1 & S2 heard, RRR. Soft systolic murmur Gastrointestinal system: Abdomen is nondistended, soft and nontender.   Central nervous system: Alert and oriented. No focal neurological deficits. Extremities: Symmetric 5 x 5 power. Skin: No rashes, lesions or ulcers Psychiatry: Judgement and insight appear normal. Mood & affect appropriate.  Data Reviewed: I have personally reviewed following labs and imaging studies  CBC: Recent Labs  Lab 02/22/24 0811 02/22/24 1326 02/22/24 1741 02/23/24 0008 02/23/24 0729  WBC 4.1 3.1* 4.4 5.6  --   HGB 5.2* 6.0* 7.6* 6.7* 8.1*  HCT 17.0* 19.0* 23.3* 20.0* 24.5*  MCV 98.3 96.4 91.4 90.5  --   PLT 384 359 348 316  --    Basic Metabolic Panel: Recent Labs  Lab 02/22/24 0811  NA 135  K 3.6  CL 101  CO2 23  GLUCOSE 105*  BUN 10  CREATININE 0.79  CALCIUM 7.9*   GFR: Estimated Creatinine Clearance: 94.8 mL/min (by C-G formula based on SCr of 0.79  mg/dL). Liver Function Tests: No results for input(s): AST, ALT, ALKPHOS, BILITOT, PROT, ALBUMIN in the last 168 hours. Recent Labs  Lab 02/22/24 1017  LIPASE 29   No results for input(s): AMMONIA in the last 168 hours. Coagulation Profile: Recent Labs  Lab 02/22/24 0853  INR 1.0   Cardiac Enzymes: No results for input(s): CKTOTAL, CKMB, CKMBINDEX, TROPONINI in the last 168 hours. BNP (last 3 results) No results for input(s): PROBNP in the last 8760 hours. HbA1C: No results for input(s): HGBA1C in the last 72 hours. CBG: No results for input(s): GLUCAP in the last 168 hours. Lipid Profile: No results for input(s): CHOL, HDL, LDLCALC, TRIG, CHOLHDL, LDLDIRECT in the last 72 hours. Thyroid Function Tests: No results for input(s): TSH, T4TOTAL, FREET4, T3FREE, THYROIDAB in the last 72 hours. Anemia Panel: No results for input(s): VITAMINB12, FOLATE, FERRITIN, TIBC, IRON, RETICCTPCT in the last 72 hours. Urine analysis: No results found for: COLORURINE, APPEARANCEUR, LABSPEC, PHURINE, GLUCOSEU, HGBUR, BILIRUBINUR, KETONESUR, PROTEINUR, UROBILINOGEN, NITRITE, LEUKOCYTESUR Sepsis Labs: @LABRCNTIP (procalcitonin:4,lacticidven:4)  )No results found for this or any previous visit (from the past 240 hours).       Radiology Studies: DG Chest 2 View Result Date: 02/22/2024 EXAM: 2 VIEW(S) XRAY OF THE CHEST 02/22/2024 08:25:22 AM COMPARISON: None available. CLINICAL HISTORY: cp. Chest pain that radiates to left arm x 3 days that is worse when walking around. Current smoker. FINDINGS: LUNGS AND PLEURA: No focal pulmonary opacity. No pulmonary edema. No pleural effusion. No pneumothorax. HEART AND MEDIASTINUM: No acute abnormality of the cardiac and mediastinal silhouettes. BONES AND SOFT TISSUES: Multilevel degenerative changes of thoracic spine. IMPRESSION: 1. No acute cardiopulmonary process.  Electronically signed by: Waddell Calk MD 02/22/2024 08:46 AM EDT RP Workstation: [id number]        Scheduled Meds:  pantoprazole (PROTONIX) IV  40 mg Intravenous Q12H   Continuous Infusions:  sodium chloride Stopped (02/23/24 0836)   sodium chloride       LOS: 0 days     Devaughn KATHEE Ban, MD [facility]   If 7PM-7AM, please contact night-coverage [email or web address] Password [email or web address] 02/23/2024, 9:07 AM

## 2024-02-23 NOTE — Op Note (Signed)
 [facility] Gastroenterology Patient Name: Devin Matthews Procedure Date: 02/23/2024 12:56 PM MRN: [id number] Account #: [id number] Date of Birth: 17-Jul-1965 Admit Type: Inpatient Age: 58 Room: [facility] ENDO ROOM 3 Gender: Male Note Status: Finalized Instrument Name: Upper GI Scope [id number] Procedure:             Upper GI endoscopy Indications:           Iron deficiency anemia Providers:             Rogelia Copping MD, MD Referring MD:          No Local Md, MD (Referring MD) Medicines:             Propofol per Anesthesia Complications:         No immediate complications. Procedure:             Pre-Anesthesia Assessment:                        - Prior to the procedure, a History and Physical was                         performed, and patient medications and allergies were                         reviewed. The patient's tolerance of previous                         anesthesia was also reviewed. The risks and benefits                         of the procedure and the sedation options and risks                         were discussed with the patient. All questions were                         answered, and informed consent was obtained. Prior                         Anticoagulants: The patient has taken no anticoagulant                         or antiplatelet agents. ASA Grade Assessment: II - A                         patient with mild systemic disease. After reviewing                         the risks and benefits, the patient was deemed in                         satisfactory condition to undergo the procedure.                        After obtaining informed consent, the endoscope was                         passed under direct vision. Throughout the procedure,  the patient's blood pressure, pulse, and oxygen                         saturations were monitored continuously. The Endoscope                         was introduced through the mouth, and  advanced to the                         second part of duodenum. The upper GI endoscopy was                         accomplished without difficulty. The patient tolerated                         the procedure well. Findings:      The examined esophagus was normal.      The stomach was normal.      Localized moderate inflammation characterized by erythema was found in       the first portion of the duodenum. Impression:            - Normal esophagus.                        - Normal stomach.                        - Duodenitis.                        - No specimens collected. Recommendation:        - Return patient to hospital ward for ongoing care.                        - Clear liquid diet.                        - Continue present medications.                        - Perform a colonoscopy today.                        - Perform a colonoscopy tomorrow. Procedure Code(s):     --- Professional ---                        [phone], Esophagogastroduodenoscopy, flexible,                         transoral; diagnostic, including collection of                         specimen(s) by brushing or washing, when performed                         (separate procedure) Diagnosis Code(s):     --- Professional ---                        D50.9, Iron deficiency anemia, unspecified CPT copyright 2022 American Medical Association. All rights reserved. The codes documented in this report  are preliminary and upon coder review may  be revised to meet current compliance requirements. Rogelia Copping MD, MD 02/23/2024 1:06:20 PM This report has been signed electronically. Number of Addenda: 0 Note Initiated On: 02/23/2024 12:56 PM Estimated Blood Loss:  Estimated blood loss: none.      [facility]

## 2024-02-23 NOTE — Anesthesia Postprocedure Evaluation (Signed)
 Anesthesia Post Note  Patient: Devin Matthews  Procedure(s) Performed: EGD (ESOPHAGOGASTRODUODENOSCOPY)  Patient location during evaluation: PACU Anesthesia Type: General Level of consciousness: awake and alert Pain management: pain level controlled Vital Signs Assessment: post-procedure vital signs reviewed and stable Respiratory status: spontaneous breathing Cardiovascular status: stable Anesthetic complications: no   No notable events documented.   Last Vitals:  Vitals:   02/23/24 1308 02/23/24 1318  BP: (!) 162/68 100/65  Pulse: 79   Resp: (!) 26   Temp:    SpO2: 96%     Last Pain:  Vitals:   02/23/24 1328  TempSrc:   PainSc: 0-No pain                 VAN STAVEREN,Natassja Ollis

## 2024-02-23 NOTE — Anesthesia Procedure Notes (Signed)
 Date/Time: 02/23/2024 12:57 PM  Performed by: Duwayne Craven, CRNAPre-anesthesia Checklist: Patient identified, Emergency Drugs available, Suction available, Patient being monitored and Timeout performed Patient Re-evaluated:Patient Re-evaluated prior to induction Oxygen Delivery Method: Nasal cannula Induction Type: IV induction Placement Confirmation: CO2 detector and positive ETCO2

## 2024-02-23 NOTE — Transfer of Care (Signed)
 Immediate Anesthesia Transfer of Care Note   Patient: Devin Matthews  Procedure(s) Performed: EGD (ESOPHAGOGASTRODUODENOSCOPY)  Patient Location: PACU  Anesthesia Type:General  Level of Consciousness: awake, alert , and oriented  Airway & Oxygen Therapy: Patient Spontanous Breathing  Post-op Assessment: Report given to RN and Post -op Vital signs reviewed and stable  Post vital signs: Reviewed and stable  Last Vitals:  Vitals Value Taken Time  BP 162/68 02/23/24 13:10  Temp    Pulse 72 02/23/24 13:11  Resp 24 02/23/24 13:11  SpO2 99 % 02/23/24 13:11  Vitals shown include unfiled device data.  Last Pain:  Vitals:   02/23/24 1239  TempSrc: Tympanic  PainSc: 0-No pain         Complications: No notable events documented.

## 2024-02-23 NOTE — Progress Notes (Signed)
 Patient'so hemoglobin dropped to 6.7, notified Dr.Toledo. Received orders to transfuse 1 unit.

## 2024-02-24 ENCOUNTER — Encounter
Admission: EM | Disposition: A | Discharge status: Home / Self Care | Payer: Self-pay | Source: Home / Self Care | Attending: Emergency Medicine

## 2024-02-24 ENCOUNTER — Observation Stay: Payer: Self-pay

## 2024-02-24 ENCOUNTER — Encounter: Payer: Self-pay | Admitting: Hospitalist

## 2024-02-24 ENCOUNTER — Other Ambulatory Visit: Payer: Self-pay

## 2024-02-24 DIAGNOSIS — K5730 Diverticulosis of large intestine without perforation or abscess without bleeding: Secondary | ICD-10-CM

## 2024-02-24 DIAGNOSIS — K640 First degree hemorrhoids: Secondary | ICD-10-CM

## 2024-02-24 HISTORY — PX: COLONOSCOPY: SHX5424

## 2024-02-24 LAB — TYPE AND SCREEN
ABO/RH(D): O POS
Antibody Screen: NEGATIVE
Unit division: 0
Unit division: 0
Unit division: 0

## 2024-02-24 LAB — BPAM RBC
Blood Product Expiration Date: 202510252359
Blood Product Expiration Date: 202510252359
Blood Product Expiration Date: 202510262359
ISSUE DATE / TIME: 202509281036
ISSUE DATE / TIME: 202509281315
ISSUE DATE / TIME: 202509290232
Unit Type and Rh: 5100
Unit Type and Rh: 5100
Unit Type and Rh: 5100

## 2024-02-24 LAB — HEMOGLOBIN
Hemoglobin: 8.7 g/dL — ABNORMAL LOW (ref 13.0–17.0)
Hemoglobin: 8.9 g/dL — ABNORMAL LOW (ref 13.0–17.0)

## 2024-02-24 SURGERY — COLONOSCOPY
Anesthesia: General

## 2024-02-24 MED ORDER — PROPOFOL 10 MG/ML IV BOLUS
INTRAVENOUS | Status: DC | PRN
  Administered 2024-02-24: 50 mg via INTRAVENOUS

## 2024-02-24 MED ORDER — PROPOFOL 1000 MG/100ML IV EMUL
INTRAVENOUS | Status: AC
  Filled 2024-02-24: qty 100

## 2024-02-24 MED ORDER — PROPOFOL 500 MG/50ML IV EMUL
INTRAVENOUS | Status: DC | PRN
  Administered 2024-02-24: 150 ug/kg/min via INTRAVENOUS

## 2024-02-24 MED ORDER — FERROUS SULFATE 325 (65 FE) MG PO TABS
325.0000 mg | ORAL_TABLET | ORAL | 0 refills | Status: DC
  Filled 2024-02-24: qty 23, 46d supply, fill #0

## 2024-02-24 MED ORDER — PANTOPRAZOLE SODIUM 40 MG PO TBEC
40.0000 mg | DELAYED_RELEASE_TABLET | Freq: Every day | ORAL | 1 refills | Status: AC
  Filled 2024-02-24: qty 30, 30d supply, fill #0

## 2024-02-24 NOTE — Transfer of Care (Signed)
 Immediate Anesthesia Transfer of Care Note  Patient: Devin Matthews  Procedure(s) Performed: COLONOSCOPY  Patient Location: Endoscopy Unit  Anesthesia Type:General  Level of Consciousness: sedated  Airway & Oxygen Therapy: Patient Spontanous Breathing  Post-op Assessment: Report given to RN and Post -op Vital signs reviewed and stable  Post vital signs: Reviewed and stable  Last Vitals:  Vitals Value Taken Time  BP 96/66 02/24/24 12:37  Temp 36.3 C 02/24/24 12:36  Pulse 63 02/24/24 12:37  Resp 14 02/24/24 12:37  SpO2 100 % 02/24/24 12:37  Vitals shown include unfiled device data.  Last Pain:  Vitals:   02/24/24 1236  TempSrc: Temporal  PainSc: Asleep         Complications: No notable events documented.

## 2024-02-24 NOTE — Progress Notes (Signed)
 Per Dr Ashok Pall, dc tele monitoring

## 2024-02-24 NOTE — Anesthesia Preprocedure Evaluation (Signed)
 Anesthesia Evaluation  Patient identified by MRN, date of birth, ID band Patient awake    Reviewed: Allergy & Precautions, H&P , NPO status , Patient's Chart, lab work & pertinent test results, reviewed documented beta blocker date and time   Airway Mallampati: II   Neck ROM: full    Dental  (+) Poor Dentition   Pulmonary neg pulmonary ROS, Current Smoker and Patient abstained from smoking.   Pulmonary exam normal        Cardiovascular Exercise Tolerance: Good negative cardio ROS Normal cardiovascular exam Rhythm:regular Rate:Normal     Neuro/Psych  PSYCHIATRIC DISORDERS      negative neurological ROS     GI/Hepatic negative GI ROS, Neg liver ROS,,,  Endo/Other  negative endocrine ROS    Renal/GU negative Renal ROS  negative genitourinary   Musculoskeletal   Abdominal   Peds  Hematology  (+) Blood dyscrasia, anemia   Anesthesia Other Findings Past Medical History: No date: Known health problems: none Past Surgical History: 02/23/2024: ESOPHAGOGASTRODUODENOSCOPY; N/A     Comment:  Procedure: EGD (ESOPHAGOGASTRODUODENOSCOPY);  Surgeon:               [name], MD;  Location: [facility] ENDOSCOPY;  Service:               Endoscopy;  Laterality: N/A; BMI    Body Mass Index: 21.41 kg/m     Reproductive/Obstetrics negative OB ROS                              Anesthesia Physical Anesthesia Plan  ASA: 2  Anesthesia Plan: General   Post-op Pain Management:    Induction:   PONV Risk Score and Plan:   Airway Management Planned:   Additional Equipment:   Intra-op Plan:   Post-operative Plan:   Informed Consent: I have reviewed the patients History and Physical, chart, labs and discussed the procedure including the risks, benefits and alternatives for the proposed anesthesia with the patient or authorized representative who has indicated his/her understanding and acceptance.      Dental Advisory Given  Plan Discussed with: CRNA  Anesthesia Plan Comments:         Anesthesia Quick Evaluation

## 2024-02-24 NOTE — Anesthesia Postprocedure Evaluation (Signed)
 Anesthesia Post Note  Patient: Devin Matthews  Procedure(s) Performed: COLONOSCOPY  Patient location during evaluation: PACU Anesthesia Type: General Level of consciousness: awake and alert Pain management: pain level controlled Vital Signs Assessment: post-procedure vital signs reviewed and stable Respiratory status: spontaneous breathing, nonlabored ventilation, respiratory function stable and patient connected to nasal cannula oxygen Cardiovascular status: blood pressure returned to baseline and stable Postop Assessment: no apparent nausea or vomiting Anesthetic complications: no   No notable events documented.   Last Vitals:  Vitals:   02/24/24 1246 02/24/24 1356  BP: 105/62 117/75  Pulse: 61 (!) 59  Resp: (!) 24 19  Temp:  37.1 C  SpO2: 100% 100%    Last Pain:  Vitals:   02/24/24 1236  TempSrc: Temporal  PainSc: Asleep                 Devin Matthews

## 2024-02-24 NOTE — Discharge Summary (Signed)
 Devin Matthews FMW:[id number] DOB: [birth year] DOA: 02/22/2024  PCP: Patient, [name] Per  Admit date: 02/22/2024 Discharge date: 02/24/2024  Time spent: 35 minutes  Recommendations for Outpatient Follow-up:  Establish with a pcp, monitor hemoglobin F/u GI 1 month     Discharge Diagnoses:  Principal Problem:   GI bleeding Active Problems:   Chest pain   Alcohol use disorder   Tobacco dependence   Blood loss anemia   Symptomatic anemia   Discharge Condition: improved  Diet recommendation: heart health  Filed Weights   02/22/24 0806 02/23/24 1239  Weight: 65.8 kg 65.8 kg    History of present illness:  From admission h and p Devin Matthews is a pleasant 58 y.o. male with medical history significant for alcohol use disorder, chronic smoking who presented to ED for chest pain.  Patient complains that while he was walking he developed chest pain this morning that made him stop 15 minutes and wait to get better and started walking.  He stated that his pain was in the retrosternal area, 5/10 in intensity, sharp, acid with some shortness of breath no nausea no vomiting no diarrhea no palpitations.  He also had similar symptoms yesterday and he took 1 to 2 pills of Motrin .  He also stated that he had been having black stools for the last couple of days.  This morning he had a bowel movement which was completely black.  Patient endorsed that he drinks alcohol and last drink was yesterday, 2 beers. Patient denied any similar symptoms in the past.  He denies any fever or chills.  He denies any hematemesis or hemoptysis but endorsed that he had melena.  Hospital Course:   # Melena Reports one week of this, with normocytic anemia. No regular nsaid use but does drink heavily. EGD and colonoscopy unrevealing. Capsule study completed prior to discharge, GI ok with discharge after capsule study terminates - GI to f/u with patient regarding results of capsule study - discharge with alcohol  cessation, avoid nsaids, start PPI, GI f/u 1 month, establish with PCP, return precautions   # normocytic anemia Likely 2/2 recent blood loss with one week of melena. Hgb 5.2 on arrival, transfused a total of 3 units, hgb with appropriate rise to the 8s and stable there, no signs active bleeding - PPI as above, start oral iron as well  # Chest pain # Exertional dyspnea Likely 2/2 anemia. Serial troponins negative. Symptoms resolved with blood transfusion. CXR clear   # Alcohol abuse Reports drinking a 6-pack of beer daily. Denies history of withdrawal, no symptoms of such. Cessation counseling provided   # HCM HIV neg - f/u hcv  Procedures: EGD Colonoscopy Capsule endoscopy   Consultations: GI  Discharge Exam: Vitals:   02/24/24 1246 02/24/24 1356  BP: 105/62 117/75  Pulse: 61 (!) 59  Resp: (!) 24 19  Temp:  98.7 F (37.1 C)  SpO2: 100% 100%    General: NAD Cardiovascular: RRR Respiratory: CTAB Abdomen: soft, non-tender  Discharge Instructions   Discharge Instructions     Diet - low sodium heart healthy   Complete by: As directed    Increase activity slowly   Complete by: As directed       Allergies as of 02/24/2024   No Known Allergies      Medication List     STOP taking these medications    colchicine  0.6 MG tablet   HYDROcodone -acetaminophen  5-325 MG tablet Commonly known as: Norco   ibuprofen   800 MG tablet Commonly known as: ADVIL        TAKE these medications    ferrous sulfate 325 (65 FE) MG EC tablet Take 1 tablet (325 mg total) by mouth every other day.   pantoprazole 40 MG tablet Commonly known as: Protonix Take 1 tablet (40 mg total) by mouth daily.       No Known Allergies  Follow-up Information     [name], MD Follow up.   Specialty: Gastroenterology Why: call to schedule follow-up in approximately 1 month Contact information: [address] [place]  [place] [address] [phone]         [facility] Follow up.   Specialty: General Practice Why: this is the primary care clinic I mentioned that accepts patients who don't have health insurance Contact information: [address] [place] [address] [phone]                  The results of significant diagnostics from this hospitalization (including imaging, microbiology, ancillary and laboratory) are listed below for reference.    Significant Diagnostic Studies: DG Chest 2 View Result Date: 02/22/2024 EXAM: 2 VIEW(S) XRAY OF THE CHEST 02/22/2024 08:25:22 AM COMPARISON: None available. CLINICAL HISTORY: cp. Chest pain that radiates to left arm x 3 days that is worse when walking around. Current smoker. FINDINGS: LUNGS AND PLEURA: No focal pulmonary opacity. No pulmonary edema. No pleural effusion. No pneumothorax. HEART AND MEDIASTINUM: No acute abnormality of the cardiac and mediastinal silhouettes. BONES AND SOFT TISSUES: Multilevel degenerative changes of thoracic spine. IMPRESSION: 1. No acute cardiopulmonary process. Electronically signed by: Waddell Calk MD 02/22/2024 08:46 AM EDT RP Workstation: [id number]    Microbiology: No results found for this or any previous visit (from the past 240 hours).   Labs: Basic Metabolic Panel: Recent Labs  Lab 02/22/24 0811  NA 135  K 3.6  CL 101  CO2 23  GLUCOSE 105*  BUN 10  CREATININE 0.79  CALCIUM 7.9*   Liver Function Tests: Recent Labs  Lab 02/23/24 1718  AST 25  ALT 14  ALKPHOS 46  BILITOT 0.6  PROT 6.0*  ALBUMIN 3.1*   Recent Labs  Lab 02/22/24 1017  LIPASE 29   No results for input(s): AMMONIA in the last 168 hours. CBC: Recent Labs  Lab 02/22/24 0811 02/22/24 1326 02/22/24 1741 02/23/24 0008 02/23/24 0729 02/23/24 1718 02/24/24 0555  WBC 4.1 3.1* 4.4 5.6  --   --   --   HGB 5.2* 6.0* 7.6* 6.7* 8.1* 8.6* 8.7*  HCT 17.0* 19.0* 23.3* 20.0* 24.5*  --   --   MCV 98.3 96.4 91.4 90.5  --   --   --   PLT  384 359 348 316  --   --   --    Cardiac Enzymes: No results for input(s): CKTOTAL, CKMB, CKMBINDEX, TROPONINI in the last 168 hours. BNP: BNP (last 3 results) Recent Labs    02/23/24 0729  BNP 192.4*    ProBNP (last 3 results) No results for input(s): PROBNP in the last 8760 hours.  CBG: No results for input(s): GLUCAP in the last 168 hours.     Signed:  Devaughn KATHEE Ban MD.  [facility] 02/24/2024, 3:30 PM

## 2024-02-24 NOTE — Op Note (Signed)
 [facility] Gastroenterology Patient Name: Devin Matthews Procedure Date: 02/24/2024 12:09 PM MRN: [id number] Account #: [id number] Date of Birth: 1965-10-21 Admit Type: Outpatient Age: 58 Room: [facility] ENDO ROOM 4 Gender: Male Note Status: Finalized Instrument Name: Colon Scope [id number] Procedure:             Colonoscopy Indications:           Iron deficiency anemia Providers:             Rogelia Copping MD, MD Referring MD:          [name] Medicines:             Propofol per Anesthesia Complications:         No immediate complications. Procedure:             Pre-Anesthesia Assessment:                        - Prior to the procedure, a History and Physical was                         performed, and patient medications and allergies were                         reviewed. The patient's tolerance of previous                         anesthesia was also reviewed. The risks and benefits                         of the procedure and the sedation options and risks                         were discussed with the patient. All questions were                         answered, and informed consent was obtained. Prior                         Anticoagulants: The patient has taken no anticoagulant                         or antiplatelet agents. ASA Grade Assessment: II - A                         patient with mild systemic disease. After reviewing                         the risks and benefits, the patient was deemed in                         satisfactory condition to undergo the procedure.                        After obtaining informed consent, the colonoscope was                         passed under direct vision. Throughout the procedure,  the patient's blood pressure, pulse, and oxygen                         saturations were monitored continuously. The                         Colonoscope was introduced through the anus and                         advanced to  the the cecum, identified by appendiceal                         orifice and ileocecal valve. The colonoscopy was                         performed without difficulty. The patient tolerated                         the procedure well. The quality of the bowel                         preparation was good. Findings:      The perianal and digital rectal examinations were normal.      Multiple small-mouthed diverticula were found in the sigmoid colon.      Non-bleeding internal hemorrhoids were found during retroflexion. The       hemorrhoids were Grade I (internal hemorrhoids that do not prolapse). Impression:            - Diverticulosis in the sigmoid colon.                        - Non-bleeding internal hemorrhoids.                        - No specimens collected. Recommendation:        - Discharge patient to home.                        - Resume previous diet.                        - Continue present medications. Procedure Code(s):     --- Professional ---                        [phone], Colonoscopy, flexible; diagnostic, including                         collection of specimen(s) by brushing or washing, when                         performed (separate procedure) Diagnosis Code(s):     --- Professional ---                        D50.9, Iron deficiency anemia, unspecified CPT copyright 2022 American Medical Association. All rights reserved. The codes documented in this report are preliminary and upon coder review may  be revised to meet current compliance requirements. Rogelia Copping MD, MD 02/24/2024 12:37:37 PM This report has been signed electronically. Number of Addenda: 0 Note Initiated On: 02/24/2024  12:09 PM Scope Withdrawal Time: 0 hours 7 minutes 16 seconds  Total Procedure Duration: 0 hours 11 minutes 5 seconds  Estimated Blood Loss:  Estimated blood loss: none.      [facility]

## 2024-02-25 LAB — HCV INTERPRETATION

## 2024-02-25 LAB — HCV AB W REFLEX TO QUANT PCR: HCV Ab: NONREACTIVE

## 2024-03-15 NOTE — Progress Notes (Unsigned)
 03/16/2024 Devin Matthews [id number] Oct 15, 1965  Gastroenterology Office Note    Referring Provider: No ref. provider found Primary Care Physician:  Patient, [name] Per  Primary GI Provider: [name], MD    Chief Complaint   Chief Complaint  Patient presents with   New Patient (Initial Visit)    GI bleed-no symptoms today-     History of Present Illness   Devin Matthews is a 58 y.o. male presenting today following hospitalization for upper GI bleed, melena, and anemia.  Patient hospitalized 02/22/2024- 02/24/2024 due to chest pain, melena for 2-3 days, fatigue, and shortness of breath. HGB 5.2 on admission, Ferritin 7; HGB 8.9 on discharge.   Patient reports he was having chest discomfort and black stools when he presented to the emergency room.  Reports no symptoms following hospitalization.  He continues to take iron every other day and pantoprazole daily.  He has been taking them together at the same time.  States he is having formed bowel movement 1-2 times daily, denies having to strain or any hard stools. Denies any further episodes of melena or hematochezia.  Denies abdominal pain, nausea, vomiting.  Reports he is no longer taking any NSAIDs and 1 beer since did being discharged from the hospital.  Rarely drinks sodas has 3-4 bottles of water a day.    02/24/2024 Colonoscopy - diverticulitis in sigmoid colon - non-bleeding internal hemorrhoids. Grade I - no specimens collected.  02/23/2024 EGD  - normal esophagus and stomach - duodenitis - No specimens collected.   Capsule endoscopy- gastric ulcer, no other abnormalities or active bleeding   Past Medical History:  Diagnosis Date   Anemia    Known health problems: none     Past Surgical History:  Procedure Laterality Date   COLONOSCOPY N/A 02/24/2024   Procedure: COLONOSCOPY;  Surgeon: Devin Carmine, MD;  Location: [facility] ENDOSCOPY;  Service: Endoscopy;  Laterality: N/A;  ROOM 124    ESOPHAGOGASTRODUODENOSCOPY N/A 02/23/2024   Procedure: EGD (ESOPHAGOGASTRODUODENOSCOPY);  Surgeon: Devin Carmine, MD;  Location: [facility] ENDOSCOPY;  Service: Endoscopy;  Laterality: N/A;    Current Outpatient Medications  Medication Sig Dispense Refill   ferrous sulfate 325 (65 FE) MG tablet Take 1 tablet (325 mg total) by mouth every other day. 60 tablet 0   pantoprazole (PROTONIX) 40 MG tablet Take 1 tablet (40 mg total) by mouth daily. 120 tablet 1   No current facility-administered medications for this visit.    Allergies as of 03/16/2024   (No Known Allergies)    Family History  Problem Relation Age of Onset   Stomach cancer Mother    Prostate cancer Father     Social History   Socioeconomic History   Marital status: Married    Spouse name: Not on file   Number of children: Not on file   Years of education: Not on file   Highest education level: Not on file  Occupational History   Not on file  Tobacco Use   Smoking status: Every Day    Current packs/day: 1.00    Types: Cigarettes   Smokeless tobacco: Not on file  Substance and Sexual Activity   Alcohol use: Yes    Comment: beer   Drug use: Not Currently   Sexual activity: Not on file  Other Topics Concern   Not on file  Social History Narrative   Not on file   Social Drivers of Health   Financial Resource Strain: Not on file  Food Insecurity:  Food Insecurity Present (02/22/2024)   Hunger Vital Sign    Worried About Running Out of Food in the Last Year: Sometimes true    Ran Out of Food in the Last Year: Sometimes true  Transportation Needs: No Transportation Needs (02/22/2024)   PRAPARE - Administrator, Civil Service (Medical): No    Lack of Transportation (Non-Medical): No  Physical Activity: Not on file  Stress: Not on file  Social Connections: Not on file  Intimate Partner Violence: Not At Risk (02/22/2024)   Humiliation, Afraid, Rape, and Kick questionnaire    Fear of Current or Ex-Partner:  No    Emotionally Abused: No    Physically Abused: No    Sexually Abused: No     RELEVANT GI HISTORY, IMAGING AND LABS: CBC    Component Value Date/Time   WBC 5.6 02/23/2024 0008   RBC 2.21 (L) 02/23/2024 0008   HGB 8.9 (L) 02/24/2024 1645   HCT 24.5 (L) 02/23/2024 0729   PLT 316 02/23/2024 0008   MCV 90.5 02/23/2024 0008   MCH 30.3 02/23/2024 0008   MCHC 33.5 02/23/2024 0008   RDW 17.7 (H) 02/23/2024 0008   Recent Labs    02/22/24 0811 02/22/24 1326 02/22/24 1741 02/23/24 0008 02/23/24 0729 02/23/24 1718 02/24/24 0555 02/24/24 1645  HGB 5.2* 6.0* 7.6* 6.7* 8.1* 8.6* 8.7* 8.9*    CMP     Component Value Date/Time   NA 135 02/22/2024 0811   K 3.6 02/22/2024 0811   CL 101 02/22/2024 0811   CO2 23 02/22/2024 0811   GLUCOSE 105 (H) 02/22/2024 0811   BUN 10 02/22/2024 0811   CREATININE 0.79 02/22/2024 0811   CALCIUM 7.9 (L) 02/22/2024 0811   PROT 6.0 (L) 02/23/2024 1718   ALBUMIN 3.1 (L) 02/23/2024 1718   AST 25 02/23/2024 1718   ALT 14 02/23/2024 1718   ALKPHOS 46 02/23/2024 1718   BILITOT 0.6 02/23/2024 1718   GFRNONAA >60 02/22/2024 0811      Latest Ref Rng & Units 02/23/2024    5:18 PM  Hepatic Function  Total Protein 6.5 - 8.1 g/dL 6.0   Albumin 3.5 - 5.0 g/dL 3.1   AST 15 - 41 U/L 25   ALT 0 - 44 U/L 14   Alk Phosphatase 38 - 126 U/L 46   Total Bilirubin 0.0 - 1.2 mg/dL 0.6   Bilirubin, Direct 0.0 - 0.2 mg/dL 0.1       Review of Systems   All systems reviewed and negative except where noted in HPI.    Physical Exam  BP 118/78   Pulse 77   Temp 98.1 F (36.7 C)   Ht 6' 2 (1.88 m)   Wt 147 lb 8 oz (66.9 kg)   SpO2 98%   BMI 18.94 kg/m  No LMP for male patient. General:   Alert and oriented. Pleasant and cooperative. Well-nourished and well-developed. NAD.  Head:  Normocephalic and atraumatic. Eyes:  Without icterus Ears:  Normal auditory acuity. Lungs:  Respirations even and unlabored.  Clear throughout to auscultation.   No  wheezes, crackles, or rhonchi. No acute distress. Heart:  Regular rate and rhythm; no murmurs, clicks, rubs, or gallops. Abdomen:  Normal bowel sounds.  No bruits.  Soft, non-tender and non-distended without masses, hepatosplenomegaly or hernias noted.  No guarding or rebound tenderness.    Rectal:  Deferred. Msk:  Symmetrical without gross deformities. Normal posture. Extremities:  Without edema. Neurologic:  Alert and  oriented x4;  grossly normal neurologically. Skin:  Intact without significant lesions or rashes. Psych:  Alert and cooperative. Normal mood and affect.   Assessment & Plan   Devin Matthews is a 58 y.o. male presenting today for hospital follow up for upper GI bleed and anemia.    GI bleed with gastric ulcer. Denies any current symptoms.  - continue pantoprazole 40 mg once daily 30 min prior to meals - continue alcohol cessation and avoid NSAIDS - Repeat EGD for ulcer healing.  Will call patient back for scheduling or if there is a cancellation sooner.   Anemia - recheck CBC, iron studies - continue oral iron 325 mg every other day. Refill sent in to pharmacy  Follow-up in 3 months  I discussed the assessment and treatment plan with the patient. The patient was provided an opportunity to ask questions and all were answered. The patient agreed with the plan and demonstrated an understanding of the instructions.   The patient was advised to call back or seek an in-person evaluation if the symptoms worsen or if the condition fails to improve as anticipated.  Grayce Bohr, DNP, AGNP-C [facility] Gastroenterology

## 2024-03-16 ENCOUNTER — Encounter: Payer: Self-pay | Admitting: Family Medicine

## 2024-03-16 ENCOUNTER — Other Ambulatory Visit: Payer: Self-pay

## 2024-03-16 ENCOUNTER — Ambulatory Visit (INDEPENDENT_AMBULATORY_CARE_PROVIDER_SITE_OTHER): Payer: Self-pay | Admitting: Family Medicine

## 2024-03-16 VITALS — BP 118/78 | HR 77 | Temp 98.1°F | Ht 74.0 in | Wt 147.5 lb

## 2024-03-16 DIAGNOSIS — D500 Iron deficiency anemia secondary to blood loss (chronic): Secondary | ICD-10-CM

## 2024-03-16 DIAGNOSIS — K254 Chronic or unspecified gastric ulcer with hemorrhage: Secondary | ICD-10-CM

## 2024-03-16 DIAGNOSIS — D649 Anemia, unspecified: Secondary | ICD-10-CM

## 2024-03-16 DIAGNOSIS — K922 Gastrointestinal hemorrhage, unspecified: Secondary | ICD-10-CM

## 2024-03-16 MED ORDER — FERROUS SULFATE 325 (65 FE) MG PO TABS
325.0000 mg | ORAL_TABLET | ORAL | 0 refills | Status: AC
  Filled 2024-03-16: qty 45, 90d supply, fill #0

## 2024-03-17 ENCOUNTER — Telehealth: Payer: Self-pay

## 2024-03-17 NOTE — Telephone Encounter (Signed)
 LVM for patient to return call so we can schedule EGD ([place]) .

## 2024-03-19 ENCOUNTER — Telehealth: Payer: Self-pay

## 2024-03-19 NOTE — Telephone Encounter (Signed)
 LVM to call office so we can schedule EGD ([place] Office) .

## 2024-03-23 ENCOUNTER — Telehealth: Payer: Self-pay

## 2024-03-23 NOTE — Telephone Encounter (Signed)
 Tried calling patient- and his emergency contact Dorothe- to see if he would like to schedule EGD 04/05/24. I have left several messages but received no response.

## 2024-03-25 ENCOUNTER — Telehealth: Payer: Self-pay

## 2024-03-25 NOTE — Telephone Encounter (Signed)
 Mailed letter for patient to call office so we can schedule EGD. I have tried multiple times to reach this patient but it has been unsuccessful.
